# Patient Record
Sex: Female | Born: 2000 | Race: Black or African American | Hispanic: No | Marital: Single | Smoking: Never smoker
Health system: Southern US, Community
[De-identification: ages and names within clinical notes are randomized; demographics above are authoritative.]

## PROBLEM LIST (undated history)

## (undated) DIAGNOSIS — F909 Attention-deficit hyperactivity disorder, unspecified type: Secondary | ICD-10-CM

## (undated) DIAGNOSIS — F419 Anxiety disorder, unspecified: Secondary | ICD-10-CM

## (undated) DIAGNOSIS — J45909 Unspecified asthma, uncomplicated: Secondary | ICD-10-CM

## (undated) HISTORY — PX: NO PAST SURGERIES: SHX2092

---
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
## 2022-02-10 ENCOUNTER — Other Ambulatory Visit: Payer: Self-pay | Admitting: Orthopaedic Surgery

## 2022-02-10 ENCOUNTER — Encounter (HOSPITAL_BASED_OUTPATIENT_CLINIC_OR_DEPARTMENT_OTHER): Payer: Self-pay | Admitting: Orthopaedic Surgery

## 2022-02-10 ENCOUNTER — Other Ambulatory Visit: Payer: Self-pay

## 2022-02-10 DIAGNOSIS — S82841A Displaced bimalleolar fracture of right lower leg, initial encounter for closed fracture: Secondary | ICD-10-CM

## 2022-02-12 NOTE — Discharge Instructions (Addendum)
Deepak Ramanathan, MD [facility]  Please read the following information regarding your care after surgery.  Medications  You only need a prescription for the narcotic pain medicine (ex. oxycodone, Percocet, Norco).  All of the other medicines listed below are available over the counter. ? Aleve 2 pills twice a day for the first 3 days after surgery. ? acetominophen (Tylenol) 650 mg every 4-6 hours as you need for minor to moderate pain ? oxycodone as prescribed for severe pain  ? To help prevent blood clots, take aspirin (81 mg) twice daily for 42 days after surgery (or total duration of nonweightbearing).  You should also get up every hour while you are awake to move around.  Weight Bearing ? Do NOT bear any weight on the operated leg or foot. This means do NOT touch your surgical leg to the ground!  Cast / Splint / Dressing ? If you have a splint, do NOT remove this. Keep your splint, cast or dressing clean and dry.  Don't put anything (coat hanger, pencil, etc) down inside of it.  If it gets wet, call the office immediately to schedule an appointment for a cast change.  Swelling IMPORTANT: It is normal for you to have swelling where you had surgery. To reduce swelling and pain, keep at least 3 pillows under your leg so that your toes are above your nose and your heel is above the level of your hip.  It may be necessary to keep your foot or leg elevated for several weeks.  This is critical to helping your incisions heal and your pain to feel better.  Follow Up Call my office at [phone] when you are discharged from the hospital or surgery center to schedule an appointment to be seen 7-10 days after surgery.  Call my office at [phone] if you develop a fever >101.5 F, nausea, vomiting, bleeding from the surgical site or severe pain.     Post Anesthesia Home Care Instructions  Activity: Get plenty of rest for the remainder of the day. A responsible individual must stay with  you for 24 hours following the procedure.  For the next 24 hours, DO NOT: -Drive a car -Operate machinery -Drink alcoholic beverages -Take any medication unless instructed by your physician -Make any legal decisions or sign important papers.  Meals: Start with liquid foods such as gelatin or soup. Progress to regular foods as tolerated. Avoid greasy, spicy, heavy foods. If nausea and/or vomiting occur, drink only clear liquids until the nausea and/or vomiting subsides. Call your physician if vomiting continues.  Special Instructions/Symptoms: Your throat may feel dry or sore from the anesthesia or the breathing tube placed in your throat during surgery. If this causes discomfort, gargle with warm salt water. The discomfort should disappear within 24 hours.  If you had a scopolamine patch placed behind your ear for the management of post- operative nausea and/or vomiting:  1. The medication in the patch is effective for 72 hours, after which it should be removed.  Wrap patch in a tissue and discard in the trash. Wash hands thoroughly with soap and water. 2. You may remove the patch earlier than 72 hours if you experience unpleasant side effects which may include dry mouth, dizziness or visual disturbances. 3. Avoid touching the patch. Wash your hands with soap and water after contact with the patch.     Regional Anesthesia Blocks  1. Numbness or the inability to move the "blocked" extremity may last from 3-48 hours after placement.   The length of time depends on the medication injected and your individual response to the medication. If the numbness is not going away after 48 hours, call your surgeon.  2. The extremity that is blocked will need to be protected until the numbness is gone and the  Strength has returned. Because you cannot feel it, you will need to take extra care to avoid injury. Because it may be weak, you may have difficulty moving it or using it. You may not know what position  it is in without looking at it while the block is in effect.  3. For blocks in the legs and feet, returning to weight bearing and walking needs to be done carefully. You will need to wait until the numbness is entirely gone and the strength has returned. You should be able to move your leg and foot normally before you try and bear weight or walk. You will need someone to be with you when you first try to ensure you do not fall and possibly risk injury.  4. Bruising and tenderness at the needle site are common side effects and will resolve in a few days.  5. Persistent numbness or new problems with movement should be communicated to the surgeon or the [facility] ([phone])/ [facility] ([phone]).   

## 2022-02-12 NOTE — H&P (Signed)
ORTHOPAEDIC SURGERY H&P  Subjective:  The patient presents with right ankle fracture.   Past Medical History:  Diagnosis Date   ADHD    Anxiety    Asthma     Past Surgical History:  Procedure Laterality Date   NO PAST SURGERIES       (Not in an outpatient encounter)    No Known Allergies  Social History   Socioeconomic History   Marital status: Single    Spouse name: Not on file   Number of children: Not on file   Years of education: Not on file   Highest education level: Not on file  Occupational History   Not on file  Tobacco Use   Smoking status: Never   Smokeless tobacco: Never  Vaping Use   Vaping Use: Some days   Substances: Nicotine  Substance and Sexual Activity   Alcohol use: No   Drug use: No   Sexual activity: Not on file  Other Topics Concern   Not on file  Social History Narrative   Not on file   Social Determinants of Health   Financial Resource Strain: Not on file  Food Insecurity: Not on file  Transportation Needs: Not on file  Physical Activity: Not on file  Stress: Not on file  Social Connections: Not on file  Intimate Partner Violence: Not on file     History reviewed. No pertinent family history.   Review of Systems Pertinent items are noted in HPI.  Objective: Vital signs in last 24 hours:    02/10/2022   10:16 AM 09/18/2016    8:58 AM  Vitals with BMI  Height 5\' 8"  5\' 6"   Weight 237 lbs 126 lbs 5 oz  BMI 36.04 20.4  Systolic  108  Diastolic  71  Pulse  72      EXAM: General: Well nourished, well developed. Awake, alert and oriented to time, place, person. Normal mood and affect. No apparent distress. Breathing room air.  Right Lower Extremity: Tenderness to palpation - right ankle SILT, motor intact throughout Palpable DP and PT pulses Special testing: None  The contralateral foot/ankle was examined for comparison and noted to be neurovascularly intact with no localized deformity, swelling, or  tenderness.  Imaging Review Images taken were independently reviewed by me demonstrating right ankle fracture.  Assessment/Plan: The clinical and radiographic findings were reviewed and discussed at length with the patient.  The patient has right ankle fracture.  We spoke at length about the natural course of these findings. We discussed nonoperative and operative treatment options in detail.  I have recommended the following: Surgery in the form of right ankle ORIF, possible syndesmosis and/or deltoid fixation, possible allograft, possible external fixation with delayed definitive fixation. The risks and benefits were presented and reviewed. The risks due to infection, stiffness, nerve/vessel/tendon injury, wound healing issues, failure of this surgery, failure/irritation secondary to any implanted suture and/or hardware, need for further surgery, thromboembolic events, amputation, death among others were discussed. The patient acknowledged the explanation & agreed to proceed with the plan.   Orthopaedic Surgery [facility]

## 2022-02-13 ENCOUNTER — Ambulatory Visit
Admission: RE | Admit: 2022-02-13 | Discharge: 2022-02-13 | Disposition: A | Discharge status: Home / Self Care | Payer: Medicaid Other | Source: Ambulatory Visit | Attending: Orthopaedic Surgery | Admitting: Orthopaedic Surgery

## 2022-02-13 DIAGNOSIS — S82841A Displaced bimalleolar fracture of right lower leg, initial encounter for closed fracture: Secondary | ICD-10-CM

## 2022-02-15 DIAGNOSIS — Z01818 Encounter for other preprocedural examination: Secondary | ICD-10-CM

## 2022-02-22 ENCOUNTER — Ambulatory Visit (HOSPITAL_BASED_OUTPATIENT_CLINIC_OR_DEPARTMENT_OTHER): Payer: Medicaid Other | Admitting: Anesthesiology

## 2022-02-22 ENCOUNTER — Encounter (HOSPITAL_BASED_OUTPATIENT_CLINIC_OR_DEPARTMENT_OTHER): Payer: Self-pay | Admitting: Orthopaedic Surgery

## 2022-02-22 ENCOUNTER — Other Ambulatory Visit: Payer: Self-pay

## 2022-02-22 ENCOUNTER — Encounter (HOSPITAL_BASED_OUTPATIENT_CLINIC_OR_DEPARTMENT_OTHER)
Admission: RE | Disposition: A | Discharge status: Home / Self Care | Payer: Self-pay | Source: Home / Self Care | Attending: Orthopaedic Surgery

## 2022-02-22 ENCOUNTER — Ambulatory Visit (HOSPITAL_BASED_OUTPATIENT_CLINIC_OR_DEPARTMENT_OTHER)
Admission: RE | Admit: 2022-02-22 | Discharge: 2022-02-22 | Disposition: A | Discharge status: Home / Self Care | Payer: Medicaid Other | Attending: Orthopaedic Surgery | Admitting: Orthopaedic Surgery

## 2022-02-22 ENCOUNTER — Ambulatory Visit (HOSPITAL_COMMUNITY): Payer: Medicaid Other

## 2022-02-22 DIAGNOSIS — J45909 Unspecified asthma, uncomplicated: Secondary | ICD-10-CM

## 2022-02-22 DIAGNOSIS — S82841A Displaced bimalleolar fracture of right lower leg, initial encounter for closed fracture: Secondary | ICD-10-CM | POA: Insufficient documentation

## 2022-02-22 DIAGNOSIS — F909 Attention-deficit hyperactivity disorder, unspecified type: Secondary | ICD-10-CM

## 2022-02-22 DIAGNOSIS — Z01818 Encounter for other preprocedural examination: Secondary | ICD-10-CM

## 2022-02-22 DIAGNOSIS — F419 Anxiety disorder, unspecified: Secondary | ICD-10-CM

## 2022-02-22 HISTORY — DX: Anxiety disorder, unspecified: F41.9

## 2022-02-22 HISTORY — PX: ORIF ANKLE FRACTURE: SHX5408

## 2022-02-22 HISTORY — DX: Unspecified asthma, uncomplicated: J45.909

## 2022-02-22 LAB — POCT PREGNANCY, URINE: Preg Test, Ur: NEGATIVE

## 2022-02-22 SURGERY — OPEN REDUCTION INTERNAL FIXATION (ORIF) ANKLE FRACTURE
Anesthesia: General | Site: Ankle | Laterality: Right

## 2022-02-22 MED ORDER — ONDANSETRON HCL 4 MG/2ML IJ SOLN
INTRAMUSCULAR | Status: AC
  Filled 2022-02-22: qty 2

## 2022-02-22 MED ORDER — FENTANYL CITRATE (PF) 100 MCG/2ML IJ SOLN
INTRAMUSCULAR | Status: AC
  Filled 2022-02-22: qty 2

## 2022-02-22 MED ORDER — MIDAZOLAM HCL 5 MG/5ML IJ SOLN
INTRAMUSCULAR | Status: DC | PRN
  Administered 2022-02-22: 2 mg via INTRAVENOUS

## 2022-02-22 MED ORDER — BUPIVACAINE-EPINEPHRINE (PF) 0.5% -1:200000 IJ SOLN
INTRAMUSCULAR | Status: DC | PRN
  Administered 2022-02-22: 25 mL via PERINEURAL
  Administered 2022-02-22: 15 mL via PERINEURAL

## 2022-02-22 MED ORDER — 0.9 % SODIUM CHLORIDE (POUR BTL) OPTIME
TOPICAL | Status: DC | PRN
  Administered 2022-02-22: 500 mL

## 2022-02-22 MED ORDER — LACTATED RINGERS IV SOLN: INTRAVENOUS | Status: DC

## 2022-02-22 MED ORDER — ONDANSETRON HCL 4 MG/2ML IJ SOLN
INTRAMUSCULAR | Status: DC | PRN
  Administered 2022-02-22: 4 mg via INTRAVENOUS

## 2022-02-22 MED ORDER — CEFAZOLIN SODIUM-DEXTROSE 2-4 GM/100ML-% IV SOLN
2.0000 g | INTRAVENOUS | Status: AC
  Administered 2022-02-22: 2 g via INTRAVENOUS

## 2022-02-22 MED ORDER — HYDROMORPHONE HCL 1 MG/ML IJ SOLN
INTRAMUSCULAR | Status: DC | PRN
  Administered 2022-02-22 (×2): .5 mg via INTRAVENOUS

## 2022-02-22 MED ORDER — MIDAZOLAM HCL 2 MG/2ML IJ SOLN
INTRAMUSCULAR | Status: AC
  Filled 2022-02-22: qty 2

## 2022-02-22 MED ORDER — HYDROMORPHONE HCL 1 MG/ML IJ SOLN
INTRAMUSCULAR | Status: AC
  Filled 2022-02-22: qty 1

## 2022-02-22 MED ORDER — AMISULPRIDE (ANTIEMETIC) 5 MG/2ML IV SOLN
10.0000 mg | Freq: Once | INTRAVENOUS | Status: AC
  Administered 2022-02-22: 10 mg via INTRAVENOUS

## 2022-02-22 MED ORDER — VANCOMYCIN HCL 500 MG IV SOLR
INTRAVENOUS | Status: AC
  Filled 2022-02-22: qty 10

## 2022-02-22 MED ORDER — FENTANYL CITRATE (PF) 100 MCG/2ML IJ SOLN
100.0000 ug | Freq: Once | INTRAMUSCULAR | Status: AC
  Administered 2022-02-22: 50 ug via INTRAVENOUS

## 2022-02-22 MED ORDER — CEFAZOLIN SODIUM-DEXTROSE 2-4 GM/100ML-% IV SOLN
INTRAVENOUS | Status: AC
Start: 2022-02-22 — End: ?
  Filled 2022-02-22: qty 100

## 2022-02-22 MED ORDER — PHENYLEPHRINE 80 MCG/ML (10ML) SYRINGE FOR IV PUSH (FOR BLOOD PRESSURE SUPPORT)
PREFILLED_SYRINGE | INTRAVENOUS | Status: AC
  Filled 2022-02-22: qty 10

## 2022-02-22 MED ORDER — DEXAMETHASONE SODIUM PHOSPHATE 10 MG/ML IJ SOLN
INTRAMUSCULAR | Status: AC
Start: 2022-02-22 — End: ?
  Filled 2022-02-22: qty 1

## 2022-02-22 MED ORDER — EPHEDRINE 5 MG/ML INJ
INTRAVENOUS | Status: AC
Start: 2022-02-22 — End: ?
  Filled 2022-02-22: qty 5

## 2022-02-22 MED ORDER — AMISULPRIDE (ANTIEMETIC) 5 MG/2ML IV SOLN
INTRAVENOUS | Status: AC
Start: 2022-02-22 — End: ?
  Filled 2022-02-22: qty 2

## 2022-02-22 MED ORDER — ATROPINE SULFATE 0.4 MG/ML IV SOLN
INTRAVENOUS | Status: AC
Start: 2022-02-22 — End: ?
  Filled 2022-02-22: qty 1

## 2022-02-22 MED ORDER — HYDROMORPHONE HCL 1 MG/ML IJ SOLN: 0.2500 mg | INTRAMUSCULAR | Status: DC | PRN

## 2022-02-22 MED ORDER — VANCOMYCIN HCL 500 MG IV SOLR
INTRAVENOUS | Status: DC | PRN
  Administered 2022-02-22: 500 mg via TOPICAL

## 2022-02-22 MED ORDER — PROPOFOL 10 MG/ML IV BOLUS
INTRAVENOUS | Status: DC | PRN
  Administered 2022-02-22: 120 mg via INTRAVENOUS

## 2022-02-22 MED ORDER — FENTANYL CITRATE (PF) 100 MCG/2ML IJ SOLN
INTRAMUSCULAR | Status: DC | PRN
  Administered 2022-02-22 (×4): 25 ug via INTRAVENOUS

## 2022-02-22 MED ORDER — LIDOCAINE HCL (CARDIAC) PF 100 MG/5ML IV SOSY
PREFILLED_SYRINGE | INTRAVENOUS | Status: DC | PRN
  Administered 2022-02-22: 40 mg via INTRAVENOUS

## 2022-02-22 MED ORDER — LIDOCAINE 2% (20 MG/ML) 5 ML SYRINGE
INTRAMUSCULAR | Status: AC
Start: 2022-02-22 — End: ?
  Filled 2022-02-22: qty 5

## 2022-02-22 MED ORDER — SUCCINYLCHOLINE CHLORIDE 200 MG/10ML IV SOSY
PREFILLED_SYRINGE | INTRAVENOUS | Status: AC
Start: 2022-02-22 — End: ?
  Filled 2022-02-22: qty 10

## 2022-02-22 MED ORDER — MIDAZOLAM HCL 2 MG/2ML IJ SOLN
2.0000 mg | Freq: Once | INTRAMUSCULAR | Status: AC
  Administered 2022-02-22: 2 mg via INTRAVENOUS

## 2022-02-22 MED ORDER — DEXAMETHASONE SODIUM PHOSPHATE 10 MG/ML IJ SOLN
INTRAMUSCULAR | Status: DC | PRN
  Administered 2022-02-22: 10 mg via INTRAVENOUS

## 2022-02-22 SURGICAL SUPPLY — 85 items
APL PRP STRL LF DISP 70% ISPRP (MISCELLANEOUS) ×2
BANDAGE ESMARK 6X9 LF (GAUZE/BANDAGES/DRESSINGS) IMPLANT
BIT DRILL 2.4X140 LONG SOLID (BIT) ×2 IMPLANT
BIT DRILL 2.5X2.75 QC CALB (BIT) ×2 IMPLANT
BIT DRILL SOLID 2.0 X 110MM (DRILL) ×1 IMPLANT
BLADE SURG 15 STRL LF DISP TIS (BLADE) ×4 IMPLANT
BLADE SURG 15 STRL SS (BLADE) ×6
BNDG CMPR 9X6 STRL LF SNTH (GAUZE/BANDAGES/DRESSINGS)
BNDG COHESIVE 4X5 TAN ST LF (GAUZE/BANDAGES/DRESSINGS) ×3 IMPLANT
BNDG ELASTIC 4X5.8 VLCR STR LF (GAUZE/BANDAGES/DRESSINGS) ×3 IMPLANT
BNDG ELASTIC 6X5.8 VLCR STR LF (GAUZE/BANDAGES/DRESSINGS) ×3 IMPLANT
BNDG ESMARK 6X9 LF (GAUZE/BANDAGES/DRESSINGS)
BNDG GAUZE DERMACEA FLUFF (GAUZE/BANDAGES/DRESSINGS) ×1
BNDG GAUZE DERMACEA FLUFF 4 (GAUZE/BANDAGES/DRESSINGS) ×2 IMPLANT
BNDG GZE DERMACEA 4 6PLY (GAUZE/BANDAGES/DRESSINGS) ×2
CANISTER SUCT 1200ML W/VALVE (MISCELLANEOUS) ×3 IMPLANT
CHLORAPREP W/TINT 26 (MISCELLANEOUS) ×3 IMPLANT
COVER BACK TABLE 60X90IN (DRAPES) ×3 IMPLANT
CUFF TOURN SGL QUICK 34 (TOURNIQUET CUFF) ×3
CUFF TRNQT CYL 34X4.125X (TOURNIQUET CUFF) ×2 IMPLANT
DRAPE C-ARM 42X72 X-RAY (DRAPES) ×3 IMPLANT
DRAPE C-ARMOR (DRAPES) ×3 IMPLANT
DRAPE EXTREMITY T 121X128X90 (DISPOSABLE) ×3 IMPLANT
DRAPE IMP U-DRAPE 54X76 (DRAPES) ×3 IMPLANT
DRAPE U-SHAPE 47X51 STRL (DRAPES) ×3 IMPLANT
DRILL SOLID 2.0 X 110MM (DRILL) ×3
DRIVER TAPERED T-15 (MISCELLANEOUS) ×2 IMPLANT
DRSG PAD ABDOMINAL 8X10 ST (GAUZE/BANDAGES/DRESSINGS) ×15 IMPLANT
ELECT REM PT RETURN 9FT ADLT (ELECTROSURGICAL) ×3
ELECTRODE REM PT RTRN 9FT ADLT (ELECTROSURGICAL) ×2 IMPLANT
GAUZE SPONGE 4X4 12PLY STRL (GAUZE/BANDAGES/DRESSINGS) ×3 IMPLANT
GAUZE XEROFORM 1X8 LF (GAUZE/BANDAGES/DRESSINGS) ×3 IMPLANT
GLOVE BIO SURGEON STRL SZ7.5 (GLOVE) ×3 IMPLANT
GLOVE BIOGEL M STRL SZ7.5 (GLOVE) ×6 IMPLANT
GLOVE BIOGEL PI IND STRL 8 (GLOVE) ×2 IMPLANT
GLOVE BIOGEL PI INDICATOR 8 (GLOVE) ×1
GLOVE SURG SS PI 7.5 STRL IVOR (GLOVE) ×3 IMPLANT
GOWN STRL REUS W/ TWL LRG LVL3 (GOWN DISPOSABLE) ×4 IMPLANT
GOWN STRL REUS W/TWL LRG LVL3 (GOWN DISPOSABLE) ×6
K-WIRE ACE 1.6X6 (WIRE) ×9
KWIRE ACE 1.6X6 (WIRE) ×3 IMPLANT
NEEDLE HYPO 22GX1.5 SAFETY (NEEDLE) IMPLANT
NS IRRIG 1000ML POUR BTL (IV SOLUTION) ×3 IMPLANT
PACK BASIN DAY SURGERY FS (CUSTOM PROCEDURE TRAY) ×3 IMPLANT
PAD CAST 4YDX4 CTTN HI CHSV (CAST SUPPLIES) ×2 IMPLANT
PADDING CAST ABS 4INX4YD NS (CAST SUPPLIES)
PADDING CAST ABS COTTON 4X4 ST (CAST SUPPLIES) IMPLANT
PADDING CAST COTTON 4X4 STRL (CAST SUPPLIES) ×3
PADDING CAST COTTON 6X4 STRL (CAST SUPPLIES) ×3 IMPLANT
PADDING CAST SYNTHETIC 4 (CAST SUPPLIES) ×4
PADDING CAST SYNTHETIC 4X4 STR (CAST SUPPLIES) ×8 IMPLANT
PENCIL SMOKE EVACUATOR (MISCELLANEOUS) ×3 IMPLANT
PLATE FIBULAR COMP LOCK 10H (Plate) ×2 IMPLANT
PLATE MEDIAL MALLEOLUS 4H HOOK (Plate) ×2 IMPLANT
SANITIZER HAND PURELL 535ML FO (MISCELLANEOUS) ×3 IMPLANT
SCREW 3.5X26 NONLOCKING (Screw) ×2 IMPLANT
SCREW CORT LP T15 3.5X16 (Screw) ×4 IMPLANT
SCREW LOCK CORT STAR 3.5X12 (Screw) ×6 IMPLANT
SCREW LOCK MD 3.5X14 NS (Screw) ×2 IMPLANT
SCREW LOCK PLATE R3 2.7X8 (Screw) ×2 IMPLANT
SCREW RE3CON NL 3.5X30 (Screw) ×2 IMPLANT
SCREW TIS LP 3.5X18 NS (Screw) ×2 IMPLANT
SHEET MEDIUM DRAPE 40X70 STRL (DRAPES) ×3 IMPLANT
SLEEVE SCD COMPRESS KNEE MED (STOCKING) ×3 IMPLANT
SPIKE FLUID TRANSFER (MISCELLANEOUS) IMPLANT
SPLINT FAST PLASTER 5X30 (CAST SUPPLIES) ×20
SPLINT PLASTER CAST FAST 5X30 (CAST SUPPLIES) ×40 IMPLANT
SPONGE T-LAP 18X18 ~~LOC~~+RFID (SPONGE) ×3 IMPLANT
STOCKINETTE 6  STRL (DRAPES) ×3
STOCKINETTE 6 STRL (DRAPES) ×2 IMPLANT
SUCTION FRAZIER HANDLE 10FR (MISCELLANEOUS)
SUCTION TUBE FRAZIER 10FR DISP (MISCELLANEOUS) IMPLANT
SUT ETHILON 3 0 PS 1 (SUTURE) ×3 IMPLANT
SUT MNCRL AB 3-0 PS2 18 (SUTURE) ×3 IMPLANT
SUT VIC AB 2-0 SH 27 (SUTURE) ×3
SUT VIC AB 2-0 SH 27XBRD (SUTURE) ×2 IMPLANT
SUT VIC AB 3-0 SH 27 (SUTURE)
SUT VIC AB 3-0 SH 27X BRD (SUTURE) IMPLANT
SUT VICRYL 0 SH 27 (SUTURE) IMPLANT
SYR BULB EAR ULCER 3OZ GRN STR (SYRINGE) ×3 IMPLANT
SYR CONTROL 10ML LL (SYRINGE) IMPLANT
TOWEL GREEN STERILE FF (TOWEL DISPOSABLE) ×6 IMPLANT
TUBE CONNECTING 20X1/4 (TUBING) IMPLANT
UNDERPAD 30X36 HEAVY ABSORB (UNDERPADS AND DIAPERS) ×3 IMPLANT
WIRE OLIVE SMOOTH 1.6X100 LNG (WIRE) ×2 IMPLANT

## 2022-02-22 NOTE — Anesthesia Procedure Notes (Signed)
Anesthesia Regional Block: Popliteal block   Pre-Anesthetic Checklist: , timeout performed,  Correct Patient, Correct Site, Correct Laterality,  Correct Procedure, Correct Position, site marked,  Risks and benefits discussed,  Surgical consent,  Pre-op evaluation,  At surgeon's request and post-op pain management  Laterality: Right  Prep: chloraprep       Needles:  Injection technique: Single-shot  Needle Type: Echogenic Needle     Needle Length: 10cm  Needle Gauge: 21     Additional Needles:   Narrative:  Start time: 02/22/2022 11:44 AM End time: 02/22/2022 11:48 AM Injection made incrementally with aspirations every 5 mL.  Performed by: Personally  Anesthesiologist: Beryle Lathe, MD  Additional Notes: No pain on injection. No increased resistance to injection. Injection made in 5cc increments. Good needle visualization. Patient tolerated the procedure well.

## 2022-02-22 NOTE — H&P (Signed)
ORTHOPAEDIC SURGERY H&P  Subjective:  The patient presents with right ankle fracture.   Past Medical History:  Diagnosis Date   ADHD    Anxiety    Asthma     Past Surgical History:  Procedure Laterality Date   NO PAST SURGERIES       (Not in an outpatient encounter)    No Known Allergies  Social History   Socioeconomic History   Marital status: Single    Spouse name: Not on file   Number of children: Not on file   Years of education: Not on file   Highest education level: Not on file  Occupational History   Not on file  Tobacco Use   Smoking status: Never   Smokeless tobacco: Never  Vaping Use   Vaping Use: Some days   Substances: Nicotine  Substance and Sexual Activity   Alcohol use: No   Drug use: No   Sexual activity: Not on file  Other Topics Concern   Not on file  Social History Narrative   Not on file   Social Determinants of Health   Financial Resource Strain: Not on file  Food Insecurity: Not on file  Transportation Needs: Not on file  Physical Activity: Not on file  Stress: Not on file  Social Connections: Not on file  Intimate Partner Violence: Not on file     History reviewed. No pertinent family history.   Review of Systems Pertinent items are noted in HPI.  Objective: Vital signs in last 24 hours:    02/22/2022   10:30 AM 02/10/2022   10:16 AM 09/18/2016    8:58 AM  Vitals with BMI  Height 5\' 8"  5\' 8"  5\' 6"   Weight 228 lbs 6 oz 237 lbs 126 lbs 5 oz  BMI 34.74 36.04 20.4  Systolic 121  108  Diastolic 65  71  Pulse 65  72      EXAM: General: Well nourished, well developed. Awake, alert and oriented to time, place, person. Normal mood and affect. No apparent distress. Breathing room air.  Right Lower Extremity: Alignment - Neutral Deformity - None Skin intact, large healing medial ankle blister Tenderness to palpation - ankle joint 5/5 TA, PT, GS, Per, EHL, FHL Sensation intact to light touch throughout Palpable DP  and PT pulses Special testing: None  The contralateral foot/ankle was examined for comparison and noted to be neurovascularly intact with no localized deformity, swelling, or tenderness.  Imaging Review All images taken were independently reviewed by me.  Assessment/Plan: The clinical and radiographic findings were reviewed and discussed at length with the patient.  The patient has right ankle fracture.  We spoke at length about the natural course of these findings. We discussed nonoperative and operative treatment options in detail.  The risks and benefits were presented and reviewed. The risks due to implant failure/irritation, infection, stiffness, nerve/vessel/tendon injury, wound healing issues, failure of this surgery, need for further surgery, thromboembolic events, amputation, death among others were discussed. The patient acknowledged the explanation and agreed to proceed with the plan.   Orthopaedic Surgery [facility]

## 2022-02-22 NOTE — Transfer of Care (Signed)
Immediate Anesthesia Transfer of Care Note  Patient: Felicia Armstrong  Procedure(s) Performed: OPEN REDUCTION INTERNAL FIXATION (ORIF) ANKLE FRACTURE (Right: Ankle)  Patient Location: PACU  Anesthesia Type:General  Level of Consciousness: sedated  Airway & Oxygen Therapy: Patient Spontanous Breathing and Patient connected to face mask oxygen  Post-op Assessment: Report given to RN and Post -op Vital signs reviewed and stable  Post vital signs: Reviewed and stable  Last Vitals:  Vitals Value Taken Time  BP 116/65 02/22/22 1630  Temp    Pulse 69 02/22/22 1631  Resp 17 02/22/22 1631  SpO2 100 % 02/22/22 1631  Vitals shown include unvalidated device data.  Last Pain:  Vitals:   02/22/22 1030  TempSrc: Oral  PainSc: 0-No pain      Patients Stated Pain Goal: 3 (02/22/22 1030)  Complications: No notable events documented.

## 2022-02-22 NOTE — Anesthesia Postprocedure Evaluation (Signed)
Anesthesia Post Note  Patient: Felicia Armstrong  Procedure(s) Performed: OPEN REDUCTION INTERNAL FIXATION (ORIF) ANKLE FRACTURE (Right: Ankle)     Patient location during evaluation: PACU Anesthesia Type: General Level of consciousness: awake Pain management: pain level controlled Vital Signs Assessment: post-procedure vital signs reviewed and stable Respiratory status: spontaneous breathing Cardiovascular status: stable Postop Assessment: no apparent nausea or vomiting Anesthetic complications: no   No notable events documented.  Last Vitals:  Vitals:   02/22/22 1700 02/22/22 1724  BP: 128/84 135/88  Pulse: 83 88  Resp: 18 20  Temp:  (!) 36.2 C  SpO2: 98% 99%    Last Pain:  Vitals:   02/22/22 1724  TempSrc: Oral  PainSc: 0-No pain                 Heinz Eckert

## 2022-02-22 NOTE — Op Note (Signed)
02/22/2022  5:50 PM   PATIENT: Felicia Armstrong  21 y.o. female  MRN: [id number]   PRE-OPERATIVE DIAGNOSIS:   Closed bimalleolar fracture of right ankle   POST-OPERATIVE DIAGNOSIS:   Closed bimalleolar fracture of right ankle   PROCEDURE: Open reduction internal fixation of the right ankle (distal fibula and medial malleolus)   SURGEON:  Netta Cedars, MD   ASSISTANT: None   ANESTHESIA: General, regional   EBL: Minimal   TOURNIQUET:    Total Tourniquet Time Documented: Thigh (Right) - 140 minutes Total: Thigh (Right) - 140 minutes    COMPLICATIONS: None apparent   DISPOSITION: Extubated, awake and stable to recovery.   INDICATION FOR PROCEDURE: The patient presented with right ankle fracture sustained in [place]. She was splinted and followed up as outpatient here in [place], [place] where her mother lives. The patient herself goes to school at [employer] but is currently on summer break. She is otherwise healthy. There was a large serous fracture blister over medial aspect of ankle - this was aspirated in the clinic with 50 cc of clear fluid evacuated. Due to moderate swelling, surgical plans were deferred for an additional week to optimize limb for surgery. The patient did well over the past week and was seen the day prior to surgery to confirm excellent improvement of limb swelling and nicely healing blister without any signs of infection.  We discussed the diagnosis, alternative treatment options, risks and benefits of the above surgical intervention, as well as alternative non-operative treatments. All questions/concerns were addressed and the patient/family demonstrated appropriate understanding of the diagnosis, the procedure, the postoperative course, and overall prognosis. The patient wished to proceed with surgical intervention and signed an informed surgical consent as such, in each others presence prior to surgery.   PROCEDURE IN  DETAIL: After preoperative consent was obtained and the correct operative site was identified, the patient was brought to the operating room supine on stretcher and transferred onto operating table. General anesthesia was induced. Preoperative antibiotics were administered. Surgical timeout was taken. The patient was then positioned supine with an ipsilateral hip bump. The operative lower extremity was prepped and draped in standard sterile fashion with a tourniquet around the thigh. The extremity was exsanguinated and the tourniquet was inflated to 275 mmHg.  A standard lateral incision was made over the distal fibula. Dissection was carried down to the level of the fibula and the fracture site identified. The superficial peroneal nerve was identified and protected throughout the procedure. The fibula was noted to be shortened with interposed periosteum. The fibula was brought out to length. The fibula fracture was debrided and the edges defined to achieve cortical read. Reduction maneuver was performed using pointed reduction forceps and lobster forceps. In this manner, the fibula length was restored and fracture reduced. A lag screw was not placed given the orientation of fracture lines and comminution. Due to poor bone quality and extensive comminution at the fracture site, it was decided to use a locking distal fibula plate. We then selected a Zimmer locking plate to match the anatomy of the distal fibula and placed it laterally. This was implanted under intraoperative fluoroscopy with a combination of distal locking screws and proximal cortical & locking screws.  We then made a direct medial ankle approach and extended this proximally in anticipation of implanting a hook plate. This incision was made anterior to avoid the area of blistering. Dissection was carried down to the level of the medial malleolar fragment. A dental pick  and freer elevator were used to reduce the medial malleolar fragment. Of  note, there was anteromedial impaction of the tibial plafond and this was improved with tamping and obtaining articular surface reduction as verified by intraoperative fluoroscopy. A Paragon28 medial distal tibia hook plate was utilized to fix the reduced medial malleolar fragment and the tines were carefully inserted into the distal tip of the malleolus. The plate was oriented to best capture the major fragments of the medial comminution. We placed a non-locking screw in the hook plate and subsequently implanted more locking screws proximally and distally to further secure the plate.  A manual external rotation stress radiograph was obtained and demonstrated complete stability of the ankle mortise to testing  The surgical sites were thoroughly irrigated. The tourniquet was deflated and hemostasis achieved. The deep layers were closed using 2-0 vicryl and the subcutaneous tissues closed using 3-0 monocryl. The skin was closed without tension using 2-0 nylon suture.    The leg was cleaned with saline and sterile adaptic dressings with gauze were applied. A well padded bulky short leg splint was applied. The patient was awakened from anesthesia and transported to the recovery room in stable condition.    FOLLOW UP PLAN: -transfer to PACU, then home -strict NWB operative extremity, maximum elevation -maintain short leg splint until follow up -DVT ppx: Aspirin 81 mg twice daily while NWB -follow up as outpatient in 2 days for wound check -sutures out in 2-3 weeks with exchange of short leg splint to short leg cast in outpatient office   RADIOGRAPHS: AP, lateral, oblique and stress radiographs of the right ankle were obtained intraoperatively. These showed interval reduction and fixation of the fractures. Manual stress radiographs were taken and the ankle mortise and tibiofibular relationship were noted to be stable following fixation. All hardware is appropriately positioned and of the appropriate  lengths. No other acute injuries are noted.   Netta Cedars [facility]

## 2022-02-22 NOTE — Anesthesia Procedure Notes (Signed)
Procedure Name: LMA Insertion Date/Time: 02/22/2022 1:22 PM  Performed by: Ronnette Hila, CRNAPre-anesthesia Checklist: Patient identified, Emergency Drugs available, Suction available and Patient being monitored Patient Re-evaluated:Patient Re-evaluated prior to induction Oxygen Delivery Method: Circle System Utilized Preoxygenation: Pre-oxygenation with 100% oxygen Induction Type: IV induction Ventilation: Mask ventilation without difficulty LMA: LMA inserted LMA Size: 4.0 Number of attempts: 1 Airway Equipment and Method: bite block Placement Confirmation: positive ETCO2 Tube secured with: Tape Dental Injury: Teeth and Oropharynx as per pre-operative assessment

## 2022-02-22 NOTE — Anesthesia Procedure Notes (Signed)
Anesthesia Regional Block: Adductor canal block   Pre-Anesthetic Checklist: , timeout performed,  Correct Patient, Correct Site, Correct Laterality,  Correct Procedure, Correct Position, site marked,  Risks and benefits discussed,  Surgical consent,  Pre-op evaluation,  At surgeon's request and post-op pain management  Laterality: Right  Prep: chloraprep       Needles:  Injection technique: Single-shot  Needle Type: Echogenic Needle     Needle Length: 10cm  Needle Gauge: 21     Additional Needles:   Narrative:  Start time: 02/22/2022 11:48 AM End time: 02/22/2022 11:51 AM Injection made incrementally with aspirations every 5 mL.  Performed by: Personally  Anesthesiologist: Beryle Lathe, MD  Additional Notes: No pain on injection. No increased resistance to injection. Injection made in 5cc increments. Good needle visualization. Patient tolerated the procedure well.

## 2022-02-22 NOTE — Anesthesia Preprocedure Evaluation (Addendum)
Anesthesia Evaluation  Patient identified by MRN, date of birth, ID band Patient awake    Reviewed: Allergy & Precautions, NPO status , Patient's Chart, lab work & pertinent test results  Airway Mallampati: II  TM Distance: >3 FB Neck ROM: Full    Dental  (+) Dental Advisory Given, Teeth Intact   Pulmonary asthma , Patient abstained from smoking.,    Pulmonary exam normal        Cardiovascular negative cardio ROS Normal cardiovascular exam     Neuro/Psych PSYCHIATRIC DISORDERS Anxiety    GI/Hepatic negative GI ROS, Neg liver ROS,   Endo/Other   Obesity   Renal/GU negative Renal ROS     Musculoskeletal   Abdominal   Peds  (+) ADHD Hematology negative hematology ROS (+)   Anesthesia Other Findings   Reproductive/Obstetrics                            Anesthesia Physical Anesthesia Plan  ASA: 3  Anesthesia Plan: General   Post-op Pain Management: Regional block*   Induction: Intravenous  PONV Risk Score and Plan: 3 and Ondansetron, Dexamethasone, Midazolam and Treatment may vary due to age or medical condition  Airway Management Planned: LMA  Additional Equipment: None  Intra-op Plan:   Post-operative Plan: Extubation in OR  Informed Consent: I have reviewed the patients History and Physical, chart, labs and discussed the procedure including the risks, benefits and alternatives for the proposed anesthesia with the patient or authorized representative who has indicated his/her understanding and acceptance.     Dental advisory given  Plan Discussed with: CRNA and Anesthesiologist  Anesthesia Plan Comments:        Anesthesia Quick Evaluation

## 2022-02-22 NOTE — Progress Notes (Signed)
Assisted Dr. Mal Amabile with right, adductor canal, popliteal, ultrasound guided block. Side rails up, monitors on throughout procedure. See vital signs in flow sheet. Tolerated Procedure well.

## 2022-02-22 NOTE — H&P (Signed)
H&P Update:  -History and Physical Reviewed  -Patient has been re-examined  -No change in the plan of care  -The risks and benefits were presented and reviewed. The risks due to hardware failure/irritation, new/persistent infection, stiffness, nerve/vessel/tendon injury, nonunion/malunion, wound healing issues, use of allograft, development of arthritis, failure of this surgery, possibility of external fixation with delayed definitive surgery, need for further surgery, thromboembolic events, anesthesia/medical complications, amputation, death among others were discussed. The patient acknowledged the explanation, agreed to proceed with the plan and a consent was signed.  Felicia Armstrong

## 2022-02-24 ENCOUNTER — Encounter (HOSPITAL_BASED_OUTPATIENT_CLINIC_OR_DEPARTMENT_OTHER): Payer: Self-pay | Admitting: Orthopaedic Surgery
